# Patient Record
Sex: Male | Born: 1995 | Race: White | Hispanic: No | Marital: Single | Smoking: Current some day smoker
Health system: Southern US, Community
[De-identification: ages and names within clinical notes are randomized; demographics above are authoritative.]

---
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
## 2017-07-15 DIAGNOSIS — H5213 Myopia, bilateral: Secondary | ICD-10-CM | POA: Diagnosis not present

## 2018-09-08 DIAGNOSIS — H5213 Myopia, bilateral: Secondary | ICD-10-CM | POA: Diagnosis not present

## 2019-04-24 ENCOUNTER — Emergency Department
Admission: EM | Admit: 2019-04-24 | Discharge: 2019-04-24 | Discharge status: Against Medical Advice | Payer: Self-pay | Attending: Emergency Medicine | Admitting: Emergency Medicine

## 2019-04-24 ENCOUNTER — Other Ambulatory Visit: Payer: Self-pay

## 2019-04-24 DIAGNOSIS — F1910 Other psychoactive substance abuse, uncomplicated: Secondary | ICD-10-CM

## 2019-04-24 DIAGNOSIS — F17200 Nicotine dependence, unspecified, uncomplicated: Secondary | ICD-10-CM | POA: Insufficient documentation

## 2019-04-24 DIAGNOSIS — F1620 Hallucinogen dependence, uncomplicated: Secondary | ICD-10-CM | POA: Insufficient documentation

## 2019-04-24 NOTE — ED Notes (Signed)
First Nurse Note:  Pt here voluntarily for help. Pt denies SI.

## 2019-04-24 NOTE — ED Notes (Signed)
Pt left AMA. Papers signed. Pt refused VS.  Pt was being taken home by his girlfriend. Denies SI /  AVH.

## 2019-04-24 NOTE — ED Triage Notes (Addendum)
Pt states he took 8 tabs of acid last night (then states about 5 this morning). Denies ETOH. Pt denies hallucinations, denies N&V&D, denies pain. Pt is A&O, requesting to leave. States "I'm good." Pt denies SI and HI. Steady gait. No tremor noted.

## 2019-04-24 NOTE — ED Notes (Signed)
Pt not wanting IV fluids or to stay for observation.  Pt signed AMA papers prior to leaving with his girlfriend.

## 2019-04-24 NOTE — ED Provider Notes (Signed)
[facility] Emergency Department Provider Note ____________________________________________   First MD Initiated Contact with Patient 04/24/19 1134     (approximate)  I have reviewed the triage vital signs and the nursing notes.   HISTORY  Chief Complaint Drug Overdose    HPI Mike Thornton is a 24 y.o. male for evaluation.  Patient reports that he took several LSD pills this morning.  He was having some hallucinations that were were very cool.  These were improving.  He denies having any concerns right now.  Reports he just like to be able to go back to his house.  He was seeing cool and interesting things earlier, that is resolved  No desire to harm self or otherwise.  No desire to hurt others.  Denies suicidal ideation.  He has experienced some hallucinations that have not gone away.  Denies being any pain or discomfort.  Reports his girlfriend was concerned and brought him here.   Denies any alcohol use today.  Denies any other drug use.  Denies any paranoia.  Did not have any hallucinations that were scary or violent.  Hallucinations have gone away now.  History reviewed. No pertinent past medical history.  There are no problems to display for this patient.   History reviewed. No pertinent surgical history.  Prior to Admission medications   Not on File    Allergies Patient has no known allergies.  History reviewed. No pertinent family history.  Social History Social History   Tobacco Use  . Smoking status: Current Some Day Smoker  Substance Use Topics  . Alcohol use: Yes  . Drug use: Yes    Types: Cocaine, Marijuana    Comment: acid     Review of Systems Constitutional: No fever/chills Eyes: No visual changes.  Had some hallucinations. Cardiovascular: Denies chest pain. Respiratory: Denies shortness of breath. Gastrointestinal: No abdominal pain.  No nausea. Musculoskeletal: Negative for back pain. Skin: Negative for  rash. Neurological: Negative for headaches or weakness.    ____________________________________________   PHYSICAL EXAM:  VITAL SIGNS: ED Triage Vitals [04/24/19 1118]  Enc Vitals Group     BP 128/82     Pulse Rate (!) 124     Resp 14     Temp 98.5 F (36.9 C)     Temp Source Oral     SpO2 98 %     Weight 160 lb (72.6 kg)     Height 6\' 2"  (1.88 m)     Head Circumference      Peak Flow      Pain Score 0     Pain Loc      Pain Edu?      Excl. in GC?     Constitutional: Alert and oriented. Well appearing and in no acute distress.  He is fully oriented to self, date, month and year. Eyes: Conjunctivae are normal. Head: Atraumatic. Nose: No congestion/rhinnorhea. Mouth/Throat: Mucous membranes are moist. Neck: No stridor.  Cardiovascular: Just slightly tachycardic rate, regular rhythm. Grossly normal heart sounds.  Good peripheral circulation. Respiratory: Normal respiratory effort.  No retractions. Lungs CTAB. Gastrointestinal: Nondistended. Musculoskeletal: No lower extremity tenderness nor edema.  No tremor.  Moves all extremities with 5/5 strength.  Some slight hyperreflexia on exam. Neurologic:  Normal speech and language. No gross focal neurologic deficits are appreciated.  He walks with normal stable gait.  No ataxia. Skin:  Skin is warm, dry and intact. No rash noted. Psychiatric: Mood and affect are normal and  calm. Speech and behavior are normal.  No agitation.  ____________________________________________   LABS (all labs ordered are listed, but only abnormal results are displayed)  Labs Reviewed - No data to display ____________________________________________  EKG   ____________________________________________  RADIOLOGY   ____________________________________________   PROCEDURES  Procedure(s) performed: None  Procedures  Critical Care performed: No  ____________________________________________   INITIAL IMPRESSION / ASSESSMENT AND  PLAN / ED COURSE  Pertinent labs & imaging results that were available during my care of the patient were reviewed by me and considered in my medical decision making (see chart for details).   Patient refusing to have care such as IV started or lab work done.  Reports he just simply wants to be able to go home.  He is alert oriented, does have some hyperreflexia and slight mydriasis on examination and was slightly tachycardic, but he is fully oriented.  He is in no acute distress.  He is refusing additional care or lab draws, but did allow me to examine him carefully and we discussed his drug use and condition.  I certainly recommended observation and treatment such as IV fluids to prevent and counteract some of his symptoms such as his elevated heart rate as well as obtaining lab work.  However patient refusing this, stating he does not wish for any medical care at this time.  Does not appear to be having any active hallucinations, he denies any signs or symptoms of self-harm or injury that would lead me to think he must be under involuntary commitment.  He appears to have capacity to make medical decisions at this time.  Although his choices are not what I would recommend, he does refuse additional care and I believe that he has this ability.    04/24/2019 at 11:46 AM:  The patient requested to leave.  I considered this to be leaving against medical advice. I personally discussed the following with them:  1)  That they currently had a medical condition of drug abuse and I am concerned that they may have side effects from that.  I am concerned that he could develop confusion, side effects such as seizures, become injured or have heart related issues or other side effects from his drug use.  We also discussed the possibility that he could begin experiencing paranoia or concerning hallucinations.  2)  My proposed course of evaluation and treatment includes, but is not limited to, observation, starting  treatment including IV, giving him medications to help make sure he is hydrated and observe him to allow the side effects of the drugs to wear out before he could go home.  Benefits of staying include possible diagnosis or excluding of overdose, worsening hallucinations, seizures or an alternative serious condition such as life-threatening heart rhythms, which if identified early would lead to appropriate intervention in a timely manner lessening the burden of disability and death.  3) Risks of leaving before this had been completed include: misdiagnosis, worsening illness leading up to and including prolonged or permanent disability or death.  Specific risks pertinent, but not all inclusive, of their current medical condition include but are not limited to seizures, overdose, death, heart problems.  I also discussed alternatives including observing him closely without starting an IV or giving fluids, allowing him to be watched carefully in the ER.  Despite this they stated they wanted to leave due to not wanting to be here and not wanting to receive medical care and refused further evaluation, treatment, or admission at this  time.   They appeared mentating appropriately with good orientation, were free from distracting injury, had adequately controlled acute pain (no pain), appeared to have intact insight, judgment, and reason, and in my opinion had the capacity to make this decision.  Specifically, they were able to verbally state back in a coherent manner their current medical condition/current diagnosis, the proposed course of evaluation and/or treatment, and the risks, benefits, and alternatives of treatment versus leaving against medical advice.  Patient understands the risks and was able to verbalize them.  They understand that they may return to seek medical attention here at ANY time they want.  I strongly advised them to return to the Emergency Department immediately if they experience any new or  worsening symptoms that concern them, or simply if they reconsider continued evaluation and/or treatment as previously discussed.   They understood this is another advantage of staying, but still insisted upon leaving.  I recommended they follow-up with [facility] at the earliest available opportunity/appointment for further evaluation and treatment.   The patient was discharged against medical advice.  They did accept written discharge instructions.   ----------------------------------------- 12:09 PM on 04/24/2019 -----------------------------------------  Patient's girlfriend also at the bedside, reports earlier he was much worse.  He seemed phased out kind of then was having occasional bursts of energy, but then zoning out again.  She reports that he seems much better now on he is acting much more normal.  Again reviewed and offered observation and further treatment, patient declined this.  He will be going home with his girlfriend, she is driving, he is not driving.  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

## 2019-07-13 ENCOUNTER — Other Ambulatory Visit: Payer: Self-pay

## 2019-07-13 ENCOUNTER — Encounter (HOSPITAL_COMMUNITY): Payer: Self-pay

## 2019-07-13 ENCOUNTER — Emergency Department (HOSPITAL_COMMUNITY)
Admission: EM | Admit: 2019-07-13 | Discharge: 2019-07-13 | Disposition: A | Discharge status: Home / Self Care | Payer: 59 | Attending: Emergency Medicine | Admitting: Emergency Medicine

## 2019-07-13 DIAGNOSIS — F17210 Nicotine dependence, cigarettes, uncomplicated: Secondary | ICD-10-CM | POA: Insufficient documentation

## 2019-07-13 DIAGNOSIS — R7309 Other abnormal glucose: Secondary | ICD-10-CM | POA: Diagnosis not present

## 2019-07-13 DIAGNOSIS — E1165 Type 2 diabetes mellitus with hyperglycemia: Secondary | ICD-10-CM | POA: Diagnosis not present

## 2019-07-13 DIAGNOSIS — R7401 Elevation of levels of liver transaminase levels: Secondary | ICD-10-CM

## 2019-07-13 DIAGNOSIS — R112 Nausea with vomiting, unspecified: Secondary | ICD-10-CM

## 2019-07-13 DIAGNOSIS — R404 Transient alteration of awareness: Secondary | ICD-10-CM | POA: Diagnosis not present

## 2019-07-13 DIAGNOSIS — T402X1A Poisoning by other opioids, accidental (unintentional), initial encounter: Secondary | ICD-10-CM

## 2019-07-13 DIAGNOSIS — R000 Tachycardia, unspecified: Secondary | ICD-10-CM

## 2019-07-13 DIAGNOSIS — R739 Hyperglycemia, unspecified: Secondary | ICD-10-CM

## 2019-07-13 DIAGNOSIS — R0902 Hypoxemia: Secondary | ICD-10-CM | POA: Diagnosis not present

## 2019-07-13 DIAGNOSIS — R4020 Unspecified coma: Secondary | ICD-10-CM | POA: Diagnosis not present

## 2019-07-13 LAB — COMPREHENSIVE METABOLIC PANEL
ALT: 57 U/L — ABNORMAL HIGH (ref 0–44)
AST: 62 U/L — ABNORMAL HIGH (ref 15–41)
Albumin: 4.8 g/dL (ref 3.5–5.0)
Alkaline Phosphatase: 51 U/L (ref 38–126)
Anion gap: 10 (ref 5–15)
BUN: 18 mg/dL (ref 6–20)
CO2: 27 mmol/L (ref 22–32)
Calcium: 9 mg/dL (ref 8.9–10.3)
Chloride: 102 mmol/L (ref 98–111)
Creatinine, Ser: 1.08 mg/dL (ref 0.61–1.24)
GFR calc Af Amer: >60 mL/min (ref >60)
GFR calc non Af Amer: >60 mL/min (ref >60)
Glucose, Bld: 299 mg/dL — ABNORMAL HIGH (ref 70–99)
Potassium: 4.1 mmol/L (ref 3.5–5.1)
Sodium: 139 mmol/L (ref 135–145)
Total Bilirubin: 0.7 mg/dL (ref 0.3–1.2)
Total Protein: 7.3 g/dL (ref 6.5–8.1)

## 2019-07-13 LAB — RAPID URINE DRUG SCREEN, HOSP PERFORMED
Amphetamines: NOT DETECTED
Barbiturates: NOT DETECTED
Benzodiazepines: NOT DETECTED
Cocaine: NOT DETECTED
Opiates: NOT DETECTED
Tetrahydrocannabinol: POSITIVE — AB

## 2019-07-13 LAB — ETHANOL: Alcohol, Ethyl (B): <10 mg/dL (ref <10)

## 2019-07-13 LAB — CBC
HCT: 48.3 % (ref 39.0–52.0)
Hemoglobin: 15.9 g/dL (ref 13.0–17.0)
MCH: 29.5 pg (ref 26.0–34.0)
MCHC: 32.9 g/dL (ref 30.0–36.0)
MCV: 89.6 fL (ref 80.0–100.0)
Platelets: 206 10*3/uL (ref 150–400)
RBC: 5.39 MIL/uL (ref 4.22–5.81)
RDW: 12.8 % (ref 11.5–15.5)
WBC: 22.2 10*3/uL — ABNORMAL HIGH (ref 4.0–10.5)
nRBC: 0 % (ref 0.0–0.2)

## 2019-07-13 MED ORDER — ONDANSETRON HCL 4 MG/2ML IJ SOLN
4.0000 mg | Freq: Once | INTRAMUSCULAR | Status: AC
  Administered 2019-07-13: 4 mg via INTRAVENOUS
  Filled 2019-07-13: qty 2

## 2019-07-13 MED ORDER — SODIUM CHLORIDE 0.9 % IV BOLUS
1000.0000 mL | Freq: Once | INTRAVENOUS | Status: AC
  Administered 2019-07-13: 1000 mL via INTRAVENOUS

## 2019-07-13 NOTE — ED Provider Notes (Signed)
[place] DEPT Provider Note   CSN: [id number] Arrival date & time: 07/13/19  0458   History Chief Complaint  Patient presents with   Drug Overdose    Mike Thornton is a 24 y.o. male.  The history is provided by the patient and the EMS personnel.  Drug Overdose  He was brought in by ambulance after overdosing on oxycodone.  He reportedly snorted 30 mg of oxycodone.  EMS gave him naloxone 2 mg intranasal and 1 mg IV.  Patient denies suicidal intent.  He does admit to smoking some marijuana tonight but denies other drug use and denies alcohol use.  History reviewed. No pertinent past medical history.  There are no problems to display for this patient.   History reviewed. No pertinent surgical history.     No family history on file.  Social History   Tobacco Use   Smoking status: Current Some Day Smoker   Smokeless tobacco: Never Used  Substance Use Topics   Alcohol use: Yes   Drug use: Yes    Types: Cocaine, Marijuana    Comment: acid     Home Medications Prior to Admission medications   Not on File    Allergies    Patient has no known allergies.  Review of Systems   Review of Systems  All other systems reviewed and are negative.   Physical Exam Updated Vital Signs BP 119/67    Pulse (!) 147    Temp 98.4 F (36.9 C)    Resp 18    Ht 6\' 2"  (1.88 m)    Wt 72 kg    SpO2 94%    BMI 20.38 kg/m   Physical Exam Vitals and nursing note reviewed.   24 year old male, resting comfortably and in no acute distress. Vital signs are significant for rapid heart rate. Oxygen saturation is 94%, which is normal. Head is normocephalic and atraumatic. PERRLA, EOMI. Oropharynx is clear. Neck is nontender and supple without adenopathy or JVD. Back is nontender and there is no CVA tenderness. Lungs are clear without rales, wheezes, or rhonchi. Chest is nontender. Heart has regular rate and rhythm without murmur. Abdomen is soft, flat,  nontender without masses or hepatosplenomegaly and peristalsis is hypoactive. Extremities have no cyanosis or edema, full range of motion is present. Skin is warm and dry without rash. Neurologic: Mental status is normal, cranial nerves are intact, there are no motor or sensory deficits.  ED Results / Procedures / Treatments   Labs (all labs ordered are listed, but only abnormal results are displayed) Labs Reviewed  COMPREHENSIVE METABOLIC PANEL - Abnormal; Notable for the following components:      Result Value   Glucose, Bld 299 (*)    AST 62 (*)    ALT 57 (*)    All other components within normal limits  CBC - Abnormal; Notable for the following components:   WBC 22.2 (*)    All other components within normal limits  RAPID URINE DRUG SCREEN, HOSP PERFORMED - Abnormal; Notable for the following components:   Tetrahydrocannabinol POSITIVE (*)    All other components within normal limits  ETHANOL    EKG EKG Interpretation  Date/Time:  Wednesday July 13 2019 05:13:26 EDT Ventricular Rate:  130 PR Interval:    QRS Duration: 86 QT Interval:  295 QTC Calculation: 434 R Axis:   104 Text Interpretation: Sinus tachycardia Consider right atrial enlargement Borderline right axis deviation Borderline repolarization abnormality Borderline ST elevation,  anterior leads No old tracing to compare Confirmed by Dione Booze ([id number]) on 07/13/2019 5:28:34 AM  Procedures Procedures  CRITICAL CARE Performed by: Dione Booze Total critical care time: 45 minutes Critical care time was exclusive of separately billable procedures and treating other patients. Critical care was necessary to treat or prevent imminent or life-threatening deterioration. Critical care was time spent personally by me on the following activities: development of treatment plan with patient and/or surrogate as well as nursing, discussions with consultants, evaluation of patient's response to treatment, examination of patient,  obtaining history from patient or surrogate, ordering and performing treatments and interventions, ordering and review of laboratory studies, ordering and review of radiographic studies, pulse oximetry and re-evaluation of patient's condition.  Medications Ordered in ED Medications  ondansetron (ZOFRAN) injection 4 mg (4 mg Intravenous Given 07/13/19 0548)  sodium chloride 0.9 % bolus 1,000 mL (1,000 mLs Intravenous New Bag/Given 07/13/19 0549)    ED Course  I have reviewed the triage vital signs and the nursing notes.  Pertinent labs & imaging results that were available during my care of the patient were reviewed by me and considered in my medical decision making (see chart for details).  MDM Rules/Calculators/A&P Opioid overdose treated in the field with naloxone with excellent clinical response.  In the ED, patient has vomited.  ECG shows sinus tachycardia.  He is given IV fluids and ondansetron.  He will need to be observed in the ED.  Old records are reviewed, and he had another ED visit in March for LSD use.  7:42 AM Heart rate is come down following IV fluids. He has not had any further vomiting. He'll need to be observed a total of 4 hours in the ED. Case is signed out to Dr. Estell Harpin. Labs are significant for elevated random glucose and mildly elevated transaminases.  Final Clinical Impression(s) / ED Diagnoses Final diagnoses:  Opioid overdose, accidental or unintentional, initial encounter (HCC)  Non-intractable vomiting with nausea, unspecified vomiting type  Sinus tachycardia  Elevated random blood glucose level  Elevated transaminase level    Rx / DC Orders ED Discharge Orders    None       Dione Booze, MD 07/13/19 [phone]

## 2019-07-13 NOTE — Discharge Instructions (Addendum)
Follow up with your NA group

## 2019-07-13 NOTE — ED Triage Notes (Signed)
Pt snorted 30mg  Oxycodone. 3mg  narcan given pta. 2 IN and 1 IV

## 2019-09-09 DIAGNOSIS — H52223 Regular astigmatism, bilateral: Secondary | ICD-10-CM | POA: Diagnosis not present

## 2020-10-10 DIAGNOSIS — H52223 Regular astigmatism, bilateral: Secondary | ICD-10-CM | POA: Diagnosis not present

## 2021-10-21 DIAGNOSIS — H52223 Regular astigmatism, bilateral: Secondary | ICD-10-CM | POA: Diagnosis not present

## 2022-10-27 DIAGNOSIS — H5213 Myopia, bilateral: Secondary | ICD-10-CM | POA: Diagnosis not present

## 2023-10-28 DIAGNOSIS — H5213 Myopia, bilateral: Secondary | ICD-10-CM | POA: Diagnosis not present
